# Patient Record
Sex: Female | Born: 1980 | Race: Black or African American | Hispanic: No | Marital: Married
Health system: Southern US, Community
[De-identification: ages and names within clinical notes are randomized; demographics above are authoritative.]

---
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
## 2004-08-04 ENCOUNTER — Ambulatory Visit: Payer: Self-pay | Admitting: Obstetrics & Gynecology

## 2007-05-04 ENCOUNTER — Emergency Department: Payer: Self-pay | Admitting: Emergency Medicine

## 2007-05-16 ENCOUNTER — Ambulatory Visit: Payer: Self-pay | Admitting: Obstetrics and Gynecology

## 2008-02-03 ENCOUNTER — Ambulatory Visit: Payer: Self-pay | Admitting: Unknown Physician Specialty

## 2009-05-21 IMAGING — US US OB < 14 WEEKS - US OB TV
1 series · 17 of 28 positions shown · non-contrast
Comparison: none

REASON FOR EXAM: vaginal bleeding 12 weeks with abdominal pain
COMMENTS:   LMP: > one month ago

[Series 1: us ob < 14 weeks - us ob tv · 17 of 40 slices shown]
[im 1/40]
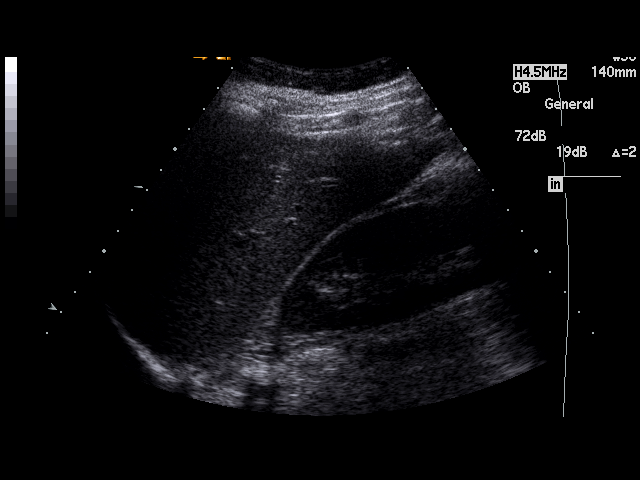
[im 3/40]
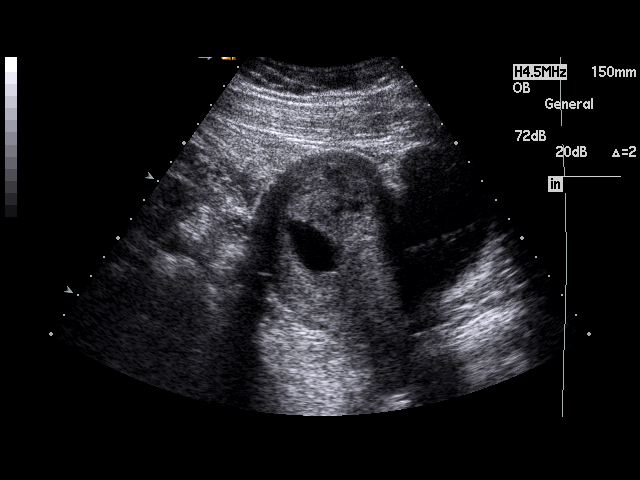
[im 6/40]
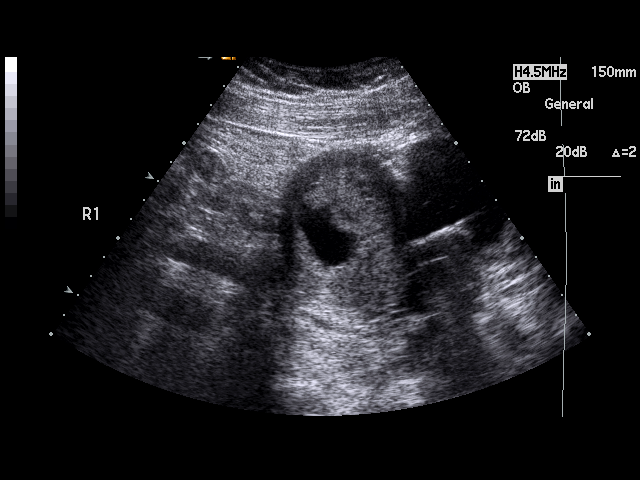
[im 8/40]
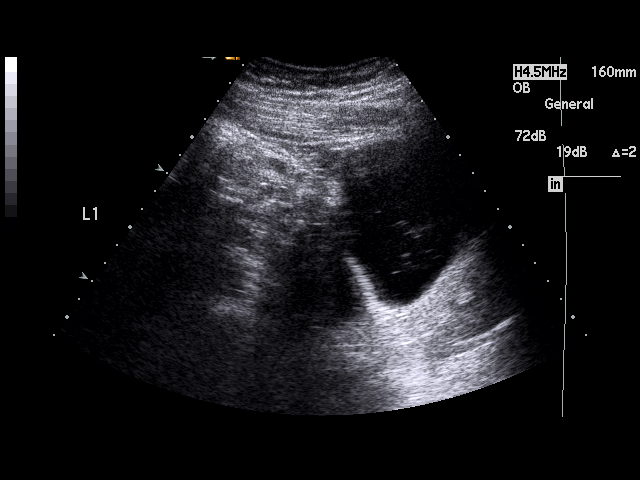
[im 11/40]
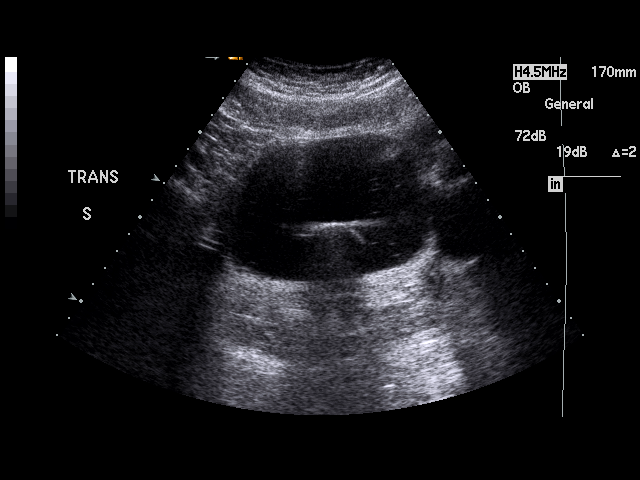
[im 14/40]
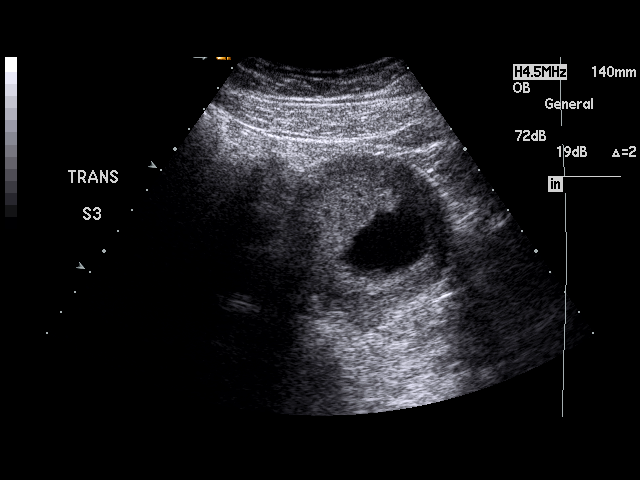
[im 15/40]
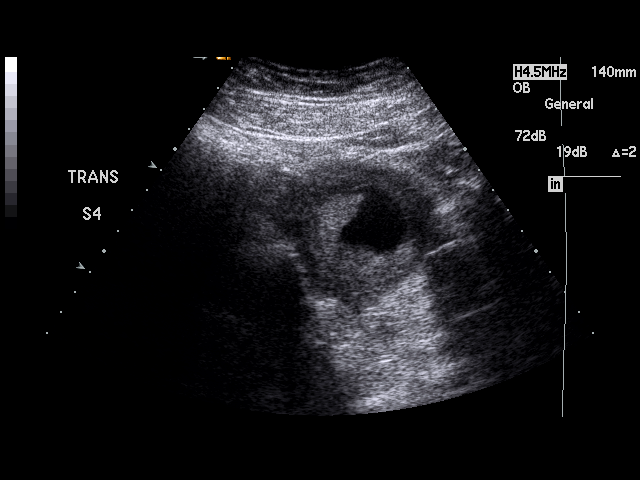
[im 18/40]
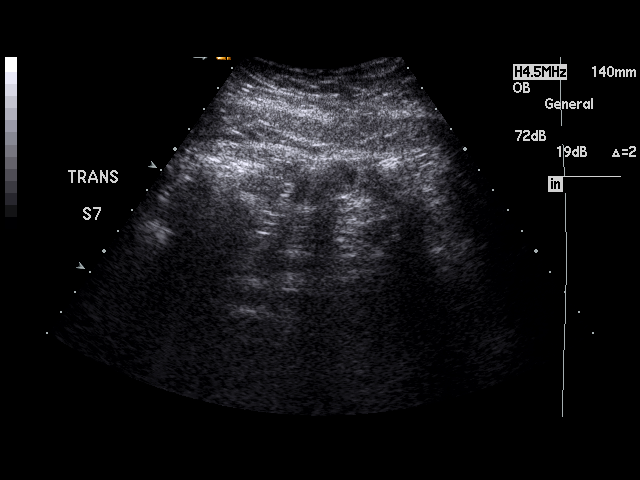
[im 21/40]
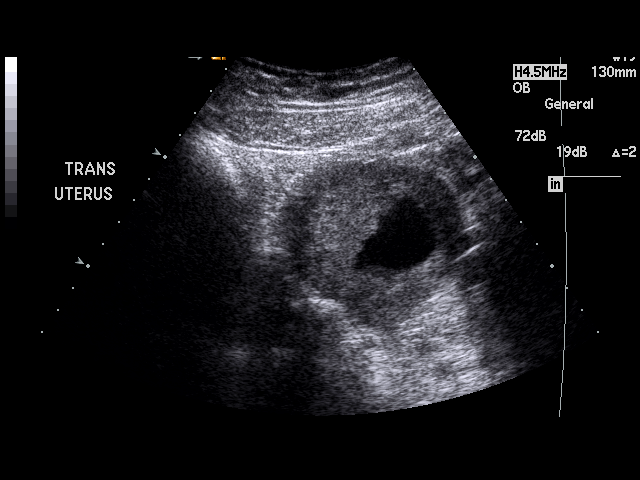
[im 22/40]
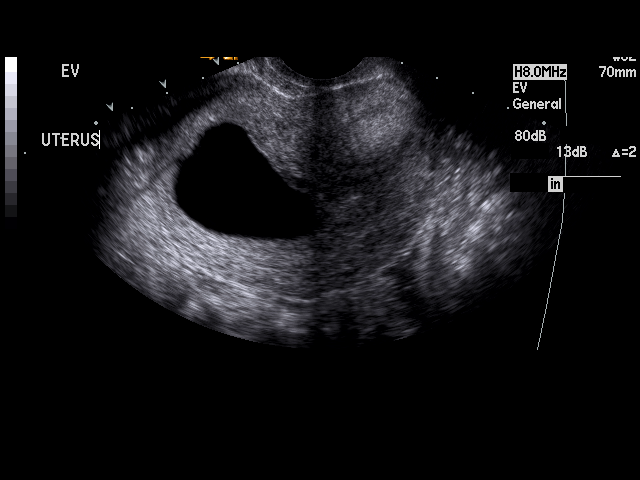
[im 25/40]
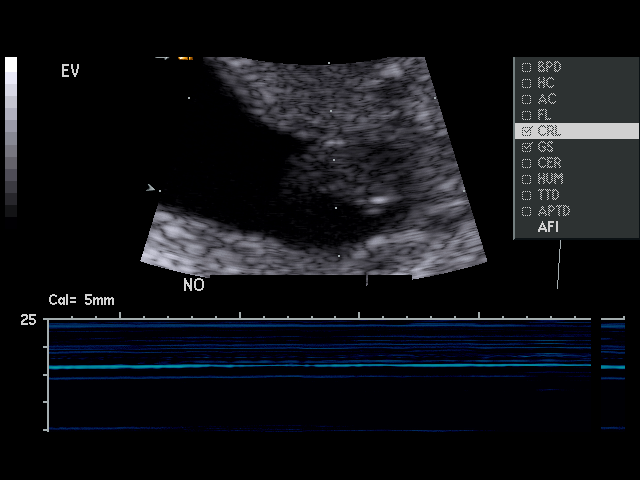
[im 27/40]
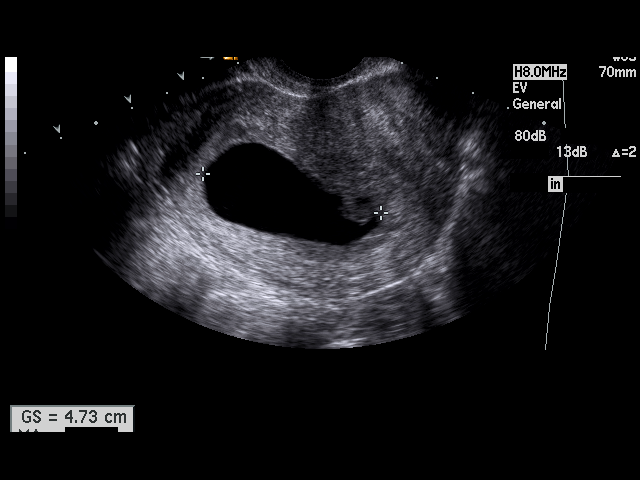
[im 29/40]
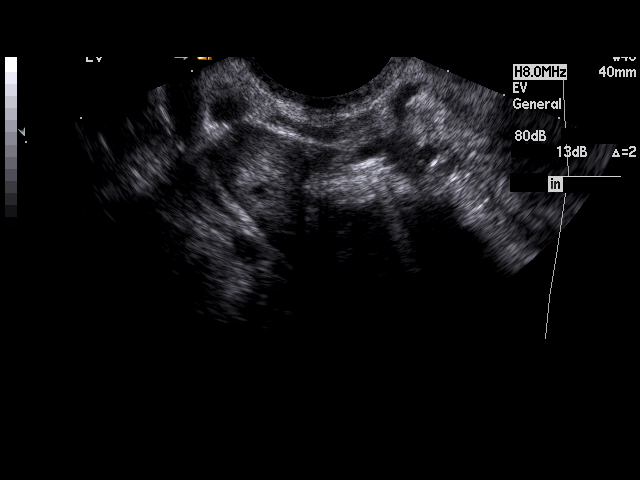
[im 32/40]
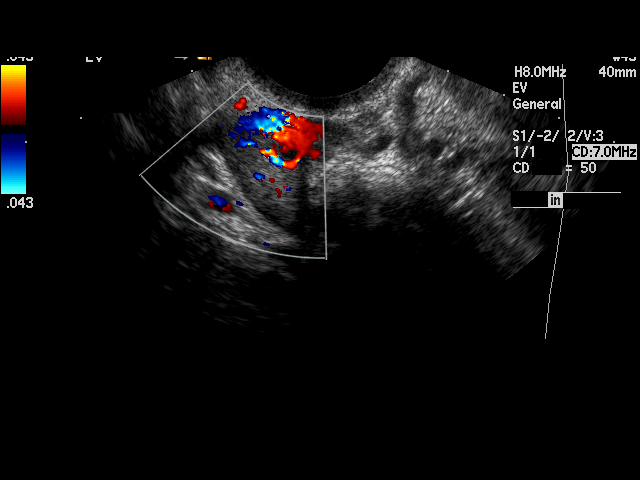
[im 34/40]
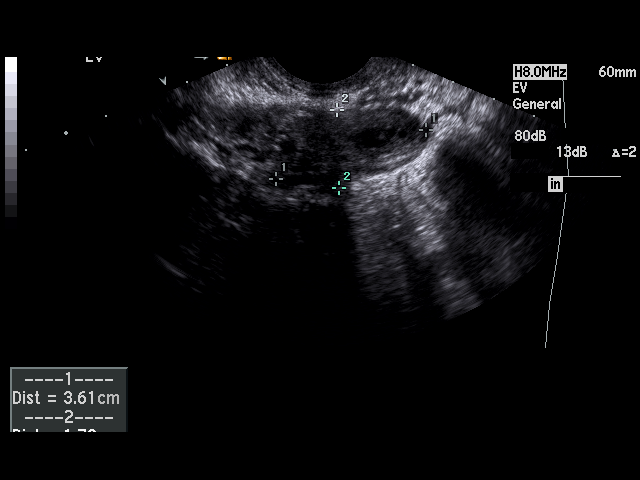
[im 37/40]
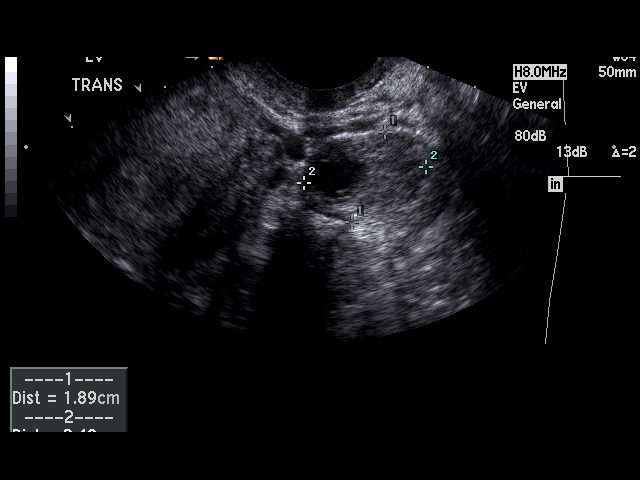
[im 40/40]
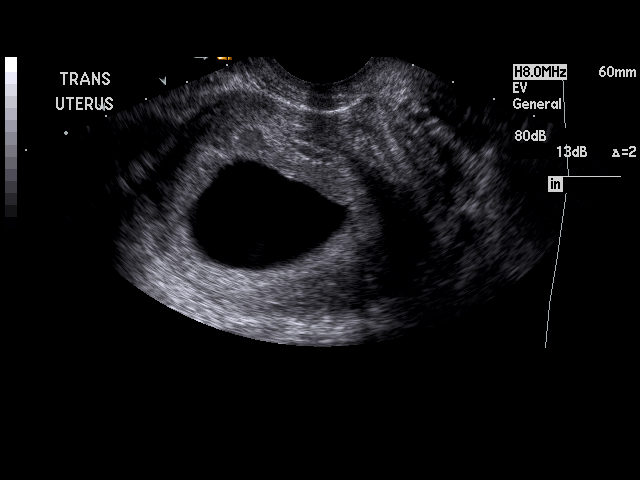

[17 of 28 positions shown; findings below may reference images not displayed]

PROCEDURE:     US  - US OB LESS THAN 14 WEEKS  - May 05, 2007  [DATE]

RESULT:     Noted is an intrauterine pregnancy with a crown-rump length of
1.4 cm corresponding to 7.5 weeks and a gestational sac of 4.5 cm
corresponding to 10 weeks.  No fetal or cardiac activity is noted.  These
findings are consistent with fetal demise. Follow-up pelvic ultrasound can
be obtained as can follow-up Beta HCG.  No adnexal abnormality is noted. No
free fluid is noted.
IMPRESSION: 1.Findings consistent with fetal demise, follow-up Beta HCG and pelvic
ultrasound should be considered.

## 2009-06-01 IMAGING — US US OB < 14 WEEKS - US OB TV
1 series · 17 of 28 positions shown · non-contrast
Comparison: none

REASON FOR EXAM: Incomplete AB
COMMENTS:

[Series 1: us ob < 14 weeks - us ob tv · 17 of 72 slices shown]
[im 1/72]
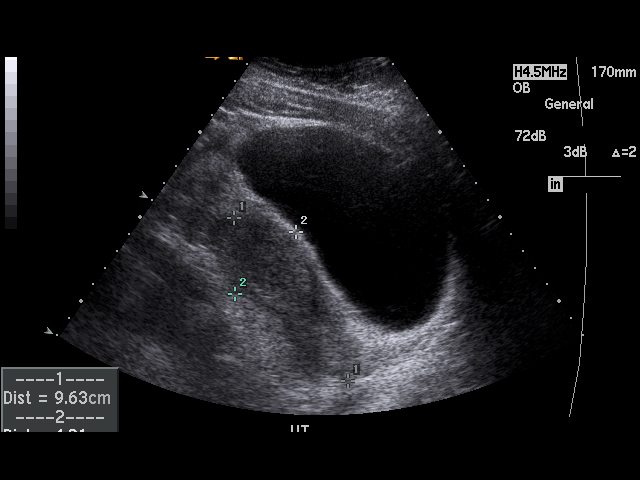
[im 6/72]
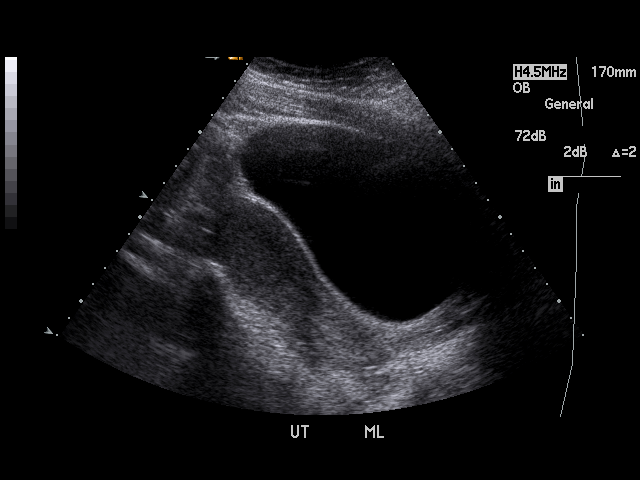
[im 11/72]
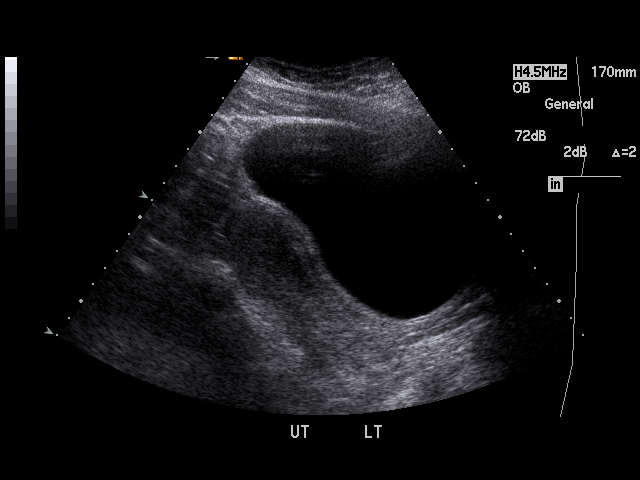
[im 14/72]
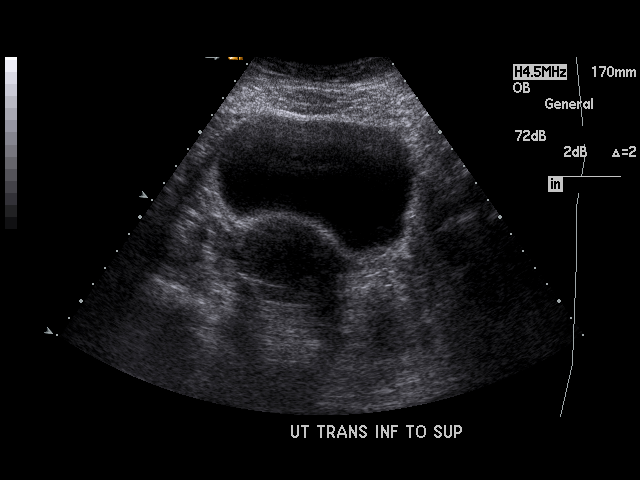
[im 19/72]
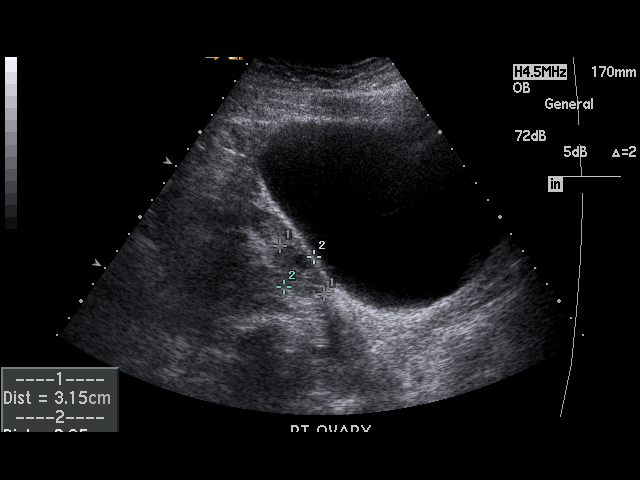
[im 24/72]
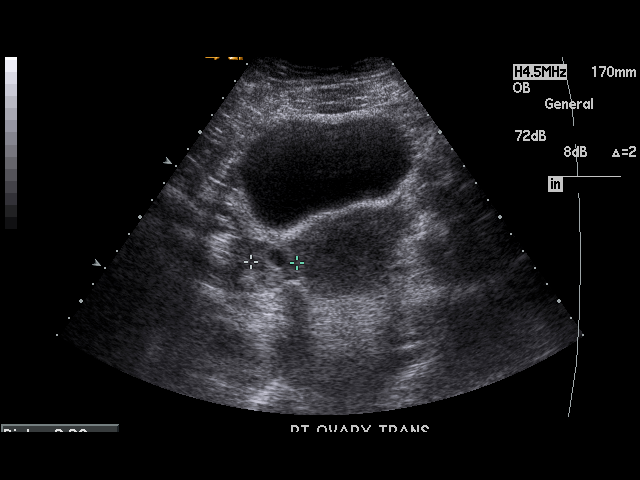
[im 27/72]
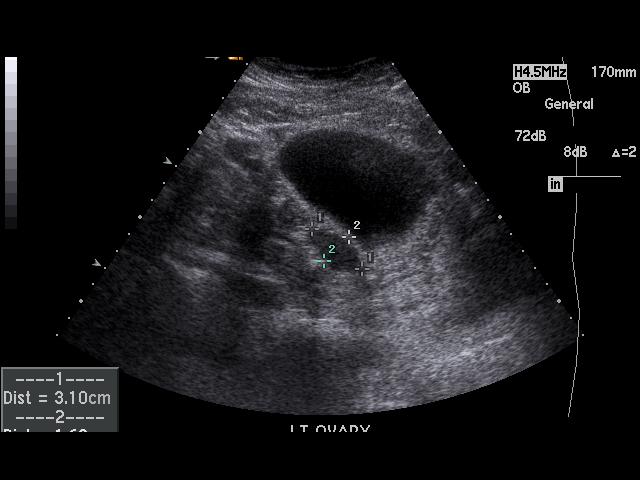
[im 32/72]
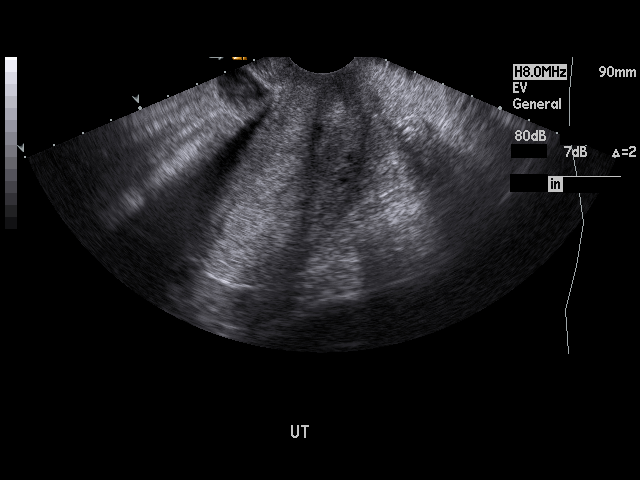
[im 37/72]
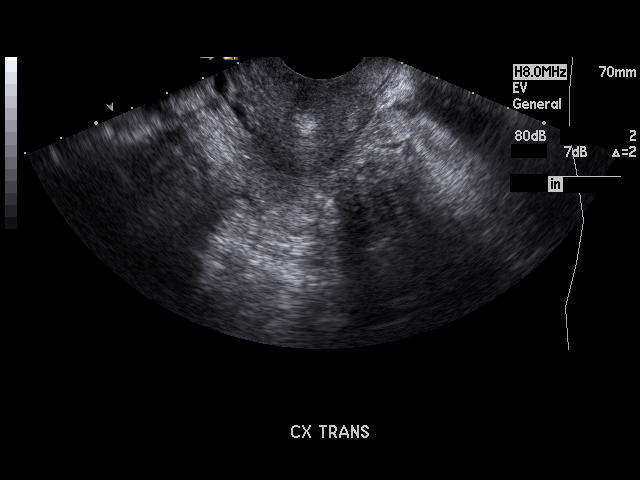
[im 40/72]
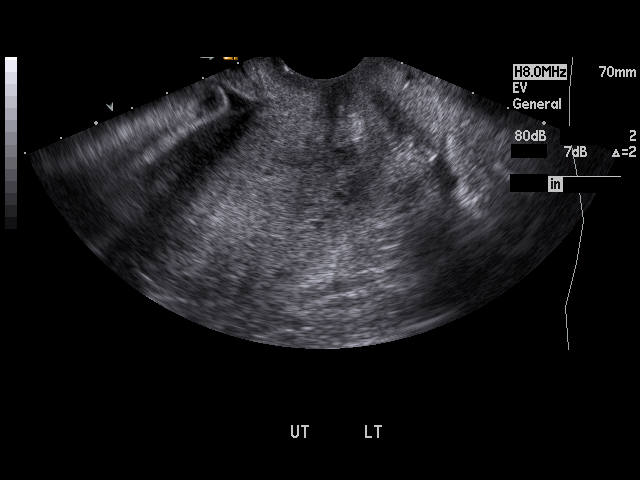
[im 45/72]
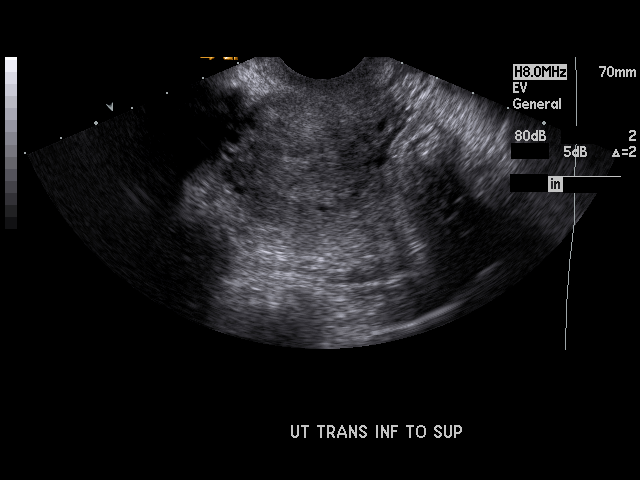
[im 48/72]
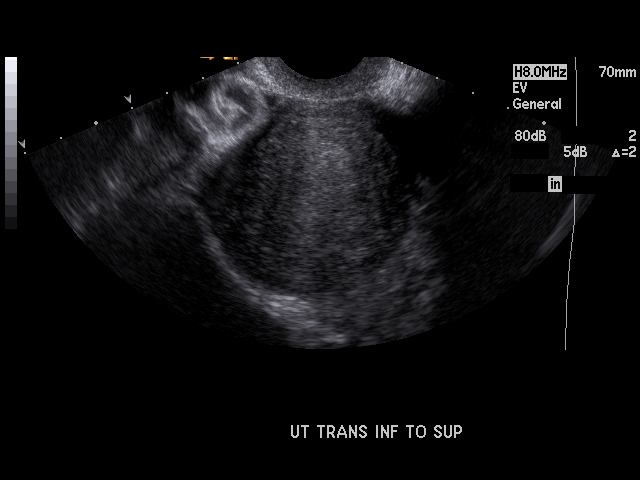
[im 53/72]
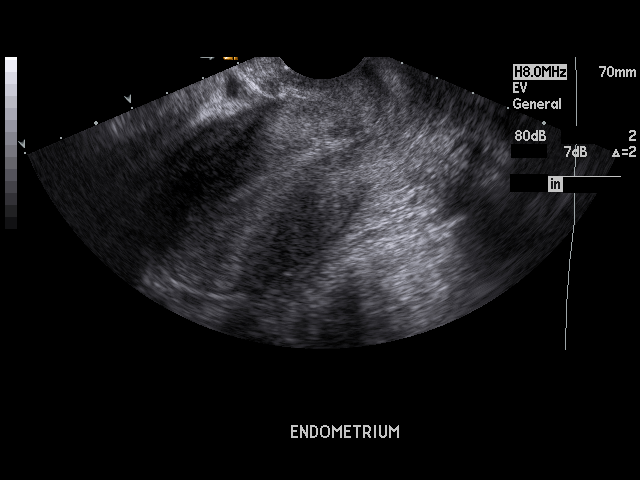
[im 58/72]
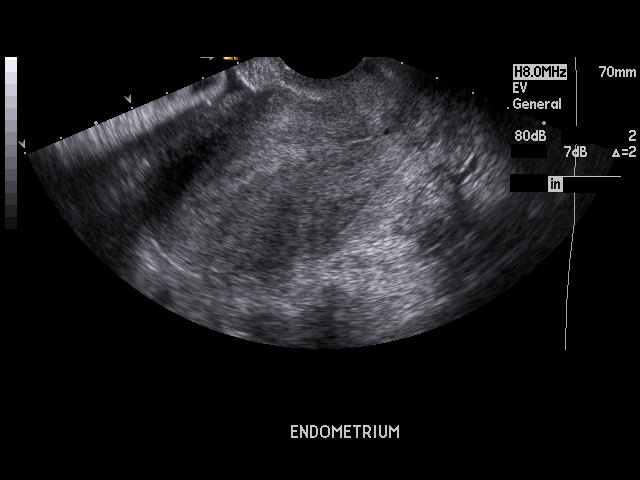
[im 61/72]
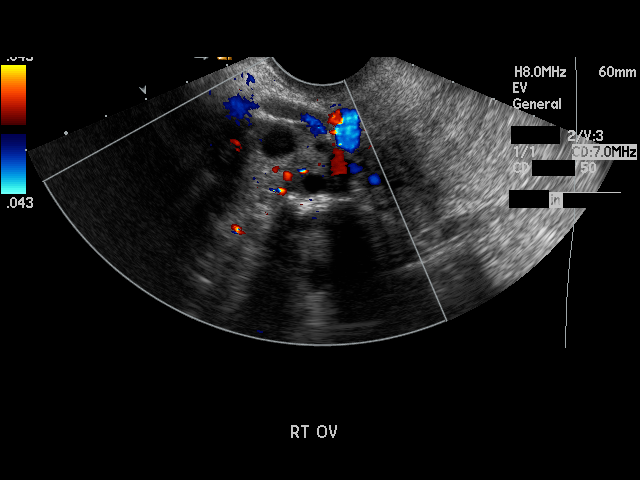
[im 66/72]
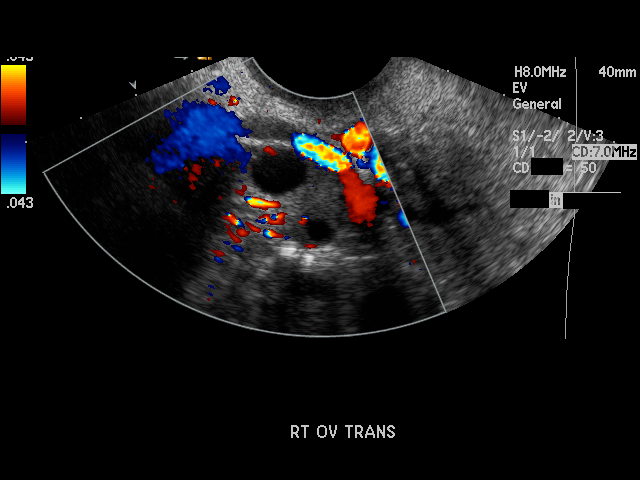
[im 72/72]
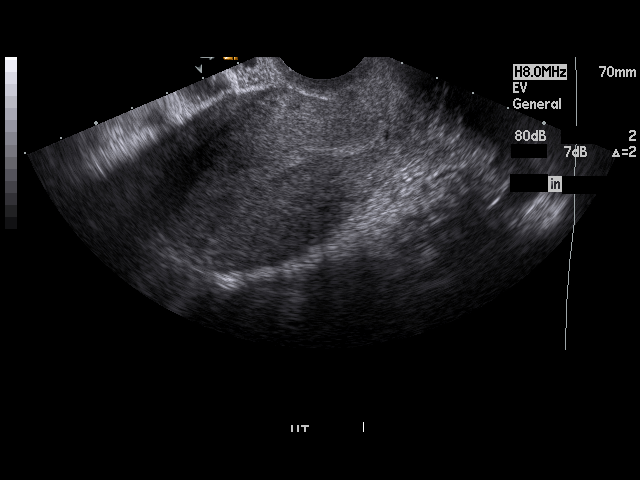

[17 of 28 positions shown; findings below may reference images not displayed]

PROCEDURE:     US  - US OB LESS THAN 14 WEEKS  - May 16, 2007  [DATE]

RESULT:     Transabdominal and endovaginal ultrasound was performed. The
uterus measures 8.15 cm x 4.44 cm x 5.35 cm.  No intrauterine gestation is
seen. The intrauterine gestation noted on the exam of 05/05/2007, is no
longer observed which suggests interval abortion. The RIGHT and LEFT ovaries
are visualized. The RIGHT ovary measures 2.55 cm at maximum diameter and the
LEFT ovary measures 3.05 cm at maximum diameter. No abnormal adnexal masses
are seen.  No free fluid is observed in the pelvis.
IMPRESSION: 1. No intrauterine products of conception are seen.
2. No abnormal adnexal masses are noted.
3. A few ovarian follicular cysts are noted.
4. No free fluid is identified in the pelvis.

## 2010-12-11 ENCOUNTER — Observation Stay: Payer: Self-pay | Admitting: Obstetrics and Gynecology

## 2011-03-20 ENCOUNTER — Inpatient Hospital Stay: Payer: Self-pay

## 2011-03-20 LAB — CBC WITH DIFFERENTIAL/PLATELET
Basophil #: 0 10*3/uL (ref 0.0–0.1)
HCT: 30.2 % — ABNORMAL LOW (ref 35.0–47.0)
Lymphocyte #: 1.5 10*3/uL (ref 1.0–3.6)
Lymphocyte %: 13.5 %
MCHC: 33.7 g/dL (ref 32.0–36.0)
MCV: 82 fL (ref 80–100)
Monocyte #: 0.8 10*3/uL — ABNORMAL HIGH (ref 0.0–0.7)
RBC: 3.69 10*6/uL — ABNORMAL LOW (ref 3.80–5.20)
RDW: 14.7 % — ABNORMAL HIGH (ref 11.5–14.5)
WBC: 11.2 10*3/uL — ABNORMAL HIGH (ref 3.6–11.0)

## 2011-03-20 LAB — RAPID HIV-1/2 QL/CONFIRM: HIV-1/2,Rapid Ql: NEGATIVE

## 2011-03-22 LAB — HEMATOCRIT: HCT: 24 % — ABNORMAL LOW (ref 35.0–47.0)

## 2011-03-25 ENCOUNTER — Ambulatory Visit: Payer: Self-pay | Admitting: Pain Medicine

## 2011-06-04 ENCOUNTER — Emergency Department: Payer: Self-pay | Admitting: *Deleted

## 2011-10-07 ENCOUNTER — Ambulatory Visit: Payer: Self-pay | Admitting: Otolaryngology

## 2014-05-22 NOTE — H&P (Signed)
L&D Evaluation:  History:   HPI 34 y/o G1 @ 38wks EDC 03/27/11 arrives with c/o regular contractions all day, denies leaking fluid or vaginal bleeding, small nl show, baby is active. Care @ [facility], obesity, GBS negative    Presents with contractions    Patient's Medical History No Chronic Illness    Patient's Surgical History none    Medications Pre Natal Vitamins    Allergies NKDA    Social History none    Family History Non-Contributory   ROS:   ROS All systems were reviewed.  HEENT, CNS, GI, GU, Respiratory, CV, Renal and Musculoskeletal systems were found to be normal.   Exam:   Vital Signs stable    Urine Protein not completed    General no apparent distress    Mental Status clear    Chest clear    Heart normal sinus rhythm    Abdomen gravid, non-tender    Estimated Fetal Weight Average for gestational age    Fetal Position vtx    Fundal Height term    Back no CVAT    Reflexes 1+    Clonus negative    Pelvic no external lesions, 4cm 90% vtx @ -1 bowi nl show    Mebranes Intact    FHT normal rate with no decels, baseline 130's avg variability with accels    Fetal Heart Rate 146    Ucx irregular    Skin dry    Lymph no lymphadenopathy   Impression:   Impression early labor   Plan:   Plan monitor contractions and for cervical change    Comments Breathing through [facility] plans IV pain meds/epidural with labor progress. VSS AF FHR reactive. DC what to expect with first baby, questions answered. Husband supportive at bedside.   Electronic Signatures: Albertina ParrLugiano, Hawraa Stambaugh B (CNM)  (Signed 08-Mar-13 21:33)  Authored: L&D Evaluation   Last Updated: 08-Mar-13 21:33 by Albertina ParrLugiano, Catheline Hixon B (CNM)

## 2019-07-31 ENCOUNTER — Other Ambulatory Visit: Payer: Self-pay | Admitting: Physician Assistant

## 2019-07-31 ENCOUNTER — Ambulatory Visit
Admission: RE | Admit: 2019-07-31 | Discharge: 2019-07-31 | Disposition: A | Discharge status: Home / Self Care | Payer: BC Managed Care – PPO | Source: Ambulatory Visit | Attending: Physician Assistant | Admitting: Physician Assistant

## 2019-07-31 ENCOUNTER — Other Ambulatory Visit: Payer: Self-pay

## 2019-07-31 DIAGNOSIS — R1031 Right lower quadrant pain: Secondary | ICD-10-CM

## 2023-05-04 ENCOUNTER — Other Ambulatory Visit: Payer: Self-pay | Admitting: Internal Medicine

## 2023-05-04 DIAGNOSIS — Z1231 Encounter for screening mammogram for malignant neoplasm of breast: Secondary | ICD-10-CM
